# Patient Record
Sex: Female | Born: 2000 | Race: White | Hispanic: No | Marital: Single | Smoking: Former smoker
Health system: Southern US, Community
[De-identification: ages and names within clinical notes are randomized; demographics above are authoritative.]

## PROBLEM LIST (undated history)

## (undated) HISTORY — PX: WISDOM TOOTH EXTRACTION: SHX21

---
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
## 2018-05-29 ENCOUNTER — Ambulatory Visit: Payer: Medicaid Other | Admitting: Family Medicine

## 2018-06-05 ENCOUNTER — Ambulatory Visit (INDEPENDENT_AMBULATORY_CARE_PROVIDER_SITE_OTHER): Payer: Medicaid Other | Admitting: Family Medicine

## 2018-06-05 ENCOUNTER — Encounter: Payer: Self-pay | Admitting: Family Medicine

## 2018-06-05 VITALS — BP 115/71 | HR 73 | Ht 65.0 in | Wt 199.0 lb

## 2018-06-05 DIAGNOSIS — N921 Excessive and frequent menstruation with irregular cycle: Secondary | ICD-10-CM

## 2018-06-05 DIAGNOSIS — Z0000 Encounter for general adult medical examination without abnormal findings: Secondary | ICD-10-CM | POA: Diagnosis not present

## 2018-06-05 MED ORDER — NORGESTIMATE-ETH ESTRADIOL 0.25-35 MG-MCG PO TABS: 1.0000 | ORAL_TABLET | Freq: Every day | ORAL | 11 refills | Status: DC

## 2018-06-05 NOTE — Progress Notes (Signed)
Pt states that her menstrual cycles are irregular.

## 2018-06-05 NOTE — Progress Notes (Signed)
GYNECOLOGY ANNUAL PREVENTATIVE CARE ENCOUNTER NOTE  Subjective:   Isabella Sloan is a 17 y.o. G0P0000 female here for a routine annual gynecologic exam.  Current complaints: irregular bleeding on junea (estradiol 20mcg combined OCP) - bleeding outside of the hormone free times.   Denies abnormal vaginal bleeding, discharge, pelvic pain, problems with intercourse or other gynecologic concerns.    Gynecologic History Patient's last menstrual period was 05/24/2018. Patient is sexually active  Contraception: OCP (estrogen/progesterone) Last Pap: n/a.  Last mammogram: n/a.  Obstetric History OB History  Gravida Para Term Preterm AB Living  0 0 0 0 0 0  SAB TAB Ectopic Multiple Live Births  0 0 0 0 0    No past medical history on file.  Past Surgical History:  Procedure Laterality Date  . WISDOM TOOTH EXTRACTION      Current Outpatient Medications on File Prior to Visit  Medication Sig Dispense Refill  . HYDROcodone-acetaminophen (NORCO/VICODIN) 5-325 MG tablet Take 1 tablet by mouth every 6 (six) hours as needed for moderate pain or severe pain. (Patient not taking: Reported on 09/03/2017) 4 tablet 0   No current facility-administered medications on file prior to visit.     No Known Allergies  Social History   Socioeconomic History  . Marital status: Single    Spouse name: Not on file  . Number of children: Not on file  . Years of education: Not on file  . Highest education level: Not on file  Occupational History  . Not on file  Social Needs  . Financial resource strain: Not on file  . Food insecurity:    Worry: Not on file    Inability: Not on file  . Transportation needs:    Medical: Not on file    Non-medical: Not on file  Tobacco Use  . Smoking status: Current Some Day Smoker    Types: E-cigarettes  . Smokeless tobacco: Never Used  Substance and Sexual Activity  . Alcohol use: No  . Drug use: No  . Sexual activity: Yes    Birth control/protection:  Pill  Lifestyle  . Physical activity:    Days per week: Not on file    Minutes per session: Not on file  . Stress: Not on file  Relationships  . Social connections:    Talks on phone: Not on file    Gets together: Not on file    Attends religious service: Not on file    Active member of club or organization: Not on file    Attends meetings of clubs or organizations: Not on file    Relationship status: Not on file  . Intimate partner violence:    Fear of current or ex partner: Not on file    Emotionally abused: Not on file    Physically abused: Not on file    Forced sexual activity: Not on file  Other Topics Concern  . Not on file  Social History Narrative  . Not on file    Family History  Problem Relation Age of Onset  . Cancer Maternal Grandfather   . Sudden death Neg Hx   . Hypertension Neg Hx   . Hyperlipidemia Neg Hx   . Heart attack Neg Hx   . Diabetes Neg Hx     The following portions of the patient's history were reviewed and updated as appropriate: allergies, current medications, past family history, past medical history, past social history, past surgical history and problem list.  Review of  Systems Pertinent items are noted in HPI.   Objective:  BP 115/71   Pulse 73   Ht 5\' 5"  (1.651 m)   Wt 199 lb (90.3 kg)   LMP 05/24/2018   BMI 33.12 kg/m  CONSTITUTIONAL: Well-developed, well-nourished female in no acute distress.  HENT:  Normocephalic, atraumatic, External right and left ear normal. Oropharynx is clear and moist EYES: Conjunctivae and EOM are normal. Pupils are equal, round, and reactive to light. No scleral icterus.  NECK: Normal range of motion, supple, no masses.  Normal thyroid.   CARDIOVASCULAR: Normal heart rate noted, regular rhythm RESPIRATORY: Clear to auscultation bilaterally. Effort and breath sounds normal, no problems with respiration noted. BREASTS: deferred ABDOMEN: Soft, normal bowel sounds, no distention noted.  No tenderness,  rebound or guarding.  PELVIC: deferred MUSCULOSKELETAL: Normal range of motion. No tenderness.  No cyanosis, clubbing, or edema.  2+ distal pulses. SKIN: Skin is warm and dry. No rash noted. Not diaphoretic. No erythema. No pallor. NEUROLOGIC: Alert and oriented to person, place, and time. Normal reflexes, muscle tone coordination. No cranial nerve deficit noted. PSYCHIATRIC: Normal mood and affect. Normal behavior. Normal judgment and thought content.  Assessment:  Annual gynecologic examination with pap smear   Plan:  1. Well Woman Exam STD testing discussed. Patient declined testing  2. Breakthrough bleeding on birth control pills Change to sprintec (35mcg OCPs). F/u in 4 months to evaluate improvement.   Routine preventative health maintenance measures emphasized. Please refer to After Visit Summary for other counseling recommendations.    Candelaria CelesteJacob , DO [facility]

## 2019-05-02 ENCOUNTER — Encounter: Payer: Self-pay | Admitting: Emergency Medicine

## 2019-05-02 ENCOUNTER — Other Ambulatory Visit: Payer: Self-pay

## 2019-05-02 ENCOUNTER — Ambulatory Visit
Admission: EM | Admit: 2019-05-02 | Discharge: 2019-05-02 | Disposition: A | Discharge status: Home / Self Care | Payer: Medicaid Other | Attending: Emergency Medicine | Admitting: Emergency Medicine

## 2019-05-02 DIAGNOSIS — J0100 Acute maxillary sinusitis, unspecified: Secondary | ICD-10-CM

## 2019-05-02 MED ORDER — AMOXICILLIN-POT CLAVULANATE 875-125 MG PO TABS: 1.0000 | ORAL_TABLET | Freq: Two times a day (BID) | ORAL | 0 refills | Status: DC

## 2019-05-02 NOTE — ED Provider Notes (Signed)
[facility]    CSN: [id number] Arrival date & time: 05/02/19  1343      History   Chief Complaint Chief Complaint  Patient presents with  . Nasal Congestion  . Facial Pain  . Otalgia    HPI Isabella Sloan is a 18 y.o. female presenting for 4-day course of sinus congestion, pressure, bilateral ear pain, and jaw pain.  Patient has taken Tylenol, ibuprofen for symptoms with mild relief. States the sinus pressure has been causing headaches during the day, keeping her up at night.  No dental pain/issues.  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  History reviewed. No pertinent past medical history.  Patient Active Problem List   Diagnosis Date Noted  . Left knee injury 02/19/2013    Past Surgical History:  Procedure Laterality Date  . WISDOM TOOTH EXTRACTION      OB History    Gravida  0   Para  0   Term  0   Preterm  0   AB  0   Living  0     SAB  0   TAB  0   Ectopic  0   Multiple  0   Live Births  0            Home Medications    Prior to Admission medications   Medication Sig Start Date End Date Taking? Authorizing Provider  amoxicillin-clavulanate (AUGMENTIN) 875-125 MG tablet Take 1 tablet by mouth every 12 (twelve) hours. 05/02/19   Hall-Potvin, [place], PA-C  norgestimate-ethinyl estradiol (ORTHO-CYCLEN,SPRINTEC,PREVIFEM) 0.25-35 MG-MCG tablet Take 1 tablet by mouth daily. 06/05/18   Levie HeritageStinson, Jacob J, DO    Family History Family History  Problem Relation Age of Onset  . Cancer Maternal Grandfather   . Sudden death Neg Hx   . Hypertension Neg Hx   . Hyperlipidemia Neg Hx   . Heart attack Neg Hx   . Diabetes Neg Hx     Social History Social History   Tobacco Use  . Smoking status: Former Smoker    Types: E-cigarettes  . Smokeless tobacco: Never Used  Substance Use Topics  . Alcohol use: No  . Drug use: No     Allergies   Patient has no known allergies.    Review of Systems Review of Systems  Constitutional: Negative for activity change, appetite change, fatigue and fever.  HENT: Positive for congestion, ear pain, sinus pressure and sinus pain. Negative for dental problem, ear discharge, facial swelling, hearing loss, sore throat, trouble swallowing and voice change.   Eyes: Negative for photophobia, pain and visual disturbance.  Respiratory: Negative for cough and shortness of breath.   Cardiovascular: Negative for chest pain and palpitations.  Gastrointestinal: Negative for diarrhea and vomiting.  Musculoskeletal: Negative for arthralgias and myalgias.  Neurological: Negative for dizziness and headaches.     Physical Exam Triage Vital Signs ED Triage Vitals  Enc Vitals Group     BP 05/02/19 1424 (!) 144/84     Pulse Rate 05/02/19 1424 89     Resp 05/02/19 1424 16     Temp 05/02/19 1424 98.2 F (36.8 C)     Temp Source 05/02/19 1424 Temporal     SpO2 05/02/19 1424 97 %     Weight --      Height --      Head Circumference --      Peak Flow --      Pain Score  05/02/19 1423 3     Pain Loc --      Pain Edu? --      Excl. in [place]? --    No data found.  Updated Vital Signs BP (!) 144/84 (BP Location: Left Arm)   Pulse 89   Temp 98.2 F (36.8 C) (Temporal)   Resp 16   LMP 04/22/2019   SpO2 97%   Visual Acuity Right Eye Distance:   Left Eye Distance:   Bilateral Distance:    Right Eye Near:   Left Eye Near:    Bilateral Near:     Physical Exam Constitutional:      General: She is not in acute distress.    Appearance: She is obese. She is not toxic-appearing.  HENT:     Head: Normocephalic and atraumatic.     Jaw: There is normal jaw occlusion. No tenderness or pain on movement.     Right Ear: Hearing, ear canal and external ear normal. No tenderness. No mastoid tenderness.     Left Ear: Hearing, ear canal and external ear normal. No tenderness. No mastoid tenderness.     Ears:     Comments: Negative tragal  tenderness bilaterally.  TMs intact bilaterally, though slight bulging (R>L>    Nose: Congestion present. No nasal deformity, septal deviation or nasal tenderness.     Right Turbinates: Not swollen or pale.     Left Turbinates: Not swollen or pale.     Right Sinus: No maxillary sinus tenderness or frontal sinus tenderness.     Left Sinus: No maxillary sinus tenderness or frontal sinus tenderness.     Comments: Maxillary sinus tenderness bilaterally    Mouth/Throat:     Lips: Pink. No lesions.     Mouth: Mucous membranes are moist. No injury.     Pharynx: Oropharynx is clear. Uvula midline. No oropharyngeal exudate, posterior oropharyngeal erythema or uvula swelling.     Comments: no tonsillar exudate or hypertrophy Eyes:     General: No scleral icterus.    Conjunctiva/sclera: Conjunctivae normal.     Pupils: Pupils are equal, round, and reactive to light.  Neck:     Musculoskeletal: Normal range of motion and neck supple. No muscular tenderness.  Cardiovascular:     Rate and Rhythm: Normal rate and regular rhythm.  Pulmonary:     Effort: Pulmonary effort is normal. No respiratory distress.     Breath sounds: No wheezing.  Lymphadenopathy:     Cervical: Cervical adenopathy present.  Skin:    General: Skin is warm.     Capillary Refill: Capillary refill takes less than 2 seconds.     Findings: No erythema or rash.  Neurological:     General: No focal deficit present.     Mental Status: She is alert and oriented to person, place, and time.      UC Treatments / Results  Labs (all labs ordered are listed, but only abnormal results are displayed) Labs Reviewed - No data to display  EKG   Radiology No results found.  Procedures Procedures (including critical care time)  Medications Ordered in UC Medications - No data to display  Initial Impression / Assessment and Plan / UC Course  I have reviewed the triage vital signs and the nursing notes.  Pertinent labs & imaging  results that were available during my care of the patient were reviewed by me and considered in my medical decision making (see chart for details).     Will  start Augmentin today for acute maxillary sinusitis.  Will treat with additional supportive measures outlined below.  Return precautions discussed, patient verbalized understanding and is agreeable to plan. Final Clinical Impressions(s) / UC Diagnoses   Final diagnoses:  Acute non-recurrent maxillary sinusitis     Discharge Instructions     Take antibiotic as prescribed with food -please note this may decrease efficacy of birth control, so backup methods (such as condoms) to be used. Important to finish your antibiotic course completely. Take Tylenol, ibuprofen, drink more water, and use intranasal steroid (Flonase, Nasacort), as well as daytime allergy medications for additional symptom relief. Return for worsening pain, fever, cough, shortness of breath.    ED Prescriptions    Medication Sig Dispense Auth. Provider   amoxicillin-clavulanate (AUGMENTIN) 875-125 MG tablet Take 1 tablet by mouth every 12 (twelve) hours. 14 tablet Hall-Potvin, [place], PA-C     PDMP not reviewed this encounter.   Hall-Potvin, [place], [place] 05/02/19 1531

## 2019-05-02 NOTE — ED Triage Notes (Addendum)
Pt presents to [facility] for 4 days of sinus congestion, pressure, "bubbling", right ear and jaw pain.  This RN offered patient COVID test, she refused at this time.

## 2019-05-02 NOTE — ED Notes (Signed)
Patient able to ambulate independently  

## 2019-05-02 NOTE — Discharge Instructions (Signed)
Take antibiotic as prescribed with food -please note this may decrease efficacy of birth control, so backup methods (such as condoms) to be used. Important to finish your antibiotic course completely. Take Tylenol, ibuprofen, drink more water, and use intranasal steroid (Flonase, Nasacort), as well as daytime allergy medications for additional symptom relief. Return for worsening pain, fever, cough, shortness of breath.

## 2019-05-20 ENCOUNTER — Other Ambulatory Visit: Payer: Self-pay | Admitting: Family Medicine

## 2019-05-20 ENCOUNTER — Other Ambulatory Visit: Payer: Self-pay

## 2019-05-20 DIAGNOSIS — Z309 Encounter for contraceptive management, unspecified: Secondary | ICD-10-CM

## 2019-05-20 MED ORDER — NORGESTIMATE-ETH ESTRADIOL 0.25-35 MG-MCG PO TABS: 1.0000 | ORAL_TABLET | Freq: Every day | ORAL | 2 refills | Status: DC

## 2019-05-20 NOTE — Progress Notes (Signed)
Pt called the office requesting a refill of OCPs. Rx was sent to pt's pharmacy with 2 refills.Pt made aware that she needs to schedule appt for annual. Pt states she will call at a later date to schedule appt for annual.  Isabella Sloan l Bekah Igoe, CMA

## 2019-07-13 ENCOUNTER — Other Ambulatory Visit: Payer: Self-pay

## 2019-07-13 DIAGNOSIS — Z3041 Encounter for surveillance of contraceptive pills: Secondary | ICD-10-CM

## 2019-07-13 MED ORDER — NORGESTIMATE-ETH ESTRADIOL 0.25-35 MG-MCG PO TABS: 1.0000 | ORAL_TABLET | Freq: Every day | ORAL | 3 refills | Status: DC

## 2019-08-06 ENCOUNTER — Ambulatory Visit: Payer: Medicaid Other | Admitting: Family Medicine

## 2019-09-01 ENCOUNTER — Other Ambulatory Visit: Payer: Self-pay

## 2019-09-01 ENCOUNTER — Other Ambulatory Visit (HOSPITAL_COMMUNITY)
Admission: RE | Admit: 2019-09-01 | Discharge: 2019-09-01 | Disposition: A | Discharge status: Home / Self Care | Payer: Medicaid Other | Source: Ambulatory Visit | Attending: Family Medicine | Admitting: Family Medicine

## 2019-09-01 ENCOUNTER — Encounter: Payer: Self-pay | Admitting: Advanced Practice Midwife

## 2019-09-01 ENCOUNTER — Ambulatory Visit (INDEPENDENT_AMBULATORY_CARE_PROVIDER_SITE_OTHER): Payer: Medicaid Other | Admitting: Advanced Practice Midwife

## 2019-09-01 VITALS — BP 116/60 | HR 68 | Ht 67.0 in | Wt 200.0 lb

## 2019-09-01 DIAGNOSIS — Z124 Encounter for screening for malignant neoplasm of cervix: Secondary | ICD-10-CM | POA: Insufficient documentation

## 2019-09-01 DIAGNOSIS — Z01419 Encounter for gynecological examination (general) (routine) without abnormal findings: Secondary | ICD-10-CM

## 2019-09-01 DIAGNOSIS — Z3041 Encounter for surveillance of contraceptive pills: Secondary | ICD-10-CM | POA: Insufficient documentation

## 2019-09-01 MED ORDER — NORGESTIMATE-ETH ESTRADIOL 0.25-35 MG-MCG PO TABS: 1.0000 | ORAL_TABLET | Freq: Every day | ORAL | 3 refills | Status: DC

## 2019-09-01 NOTE — Patient Instructions (Signed)
Health Maintenance, Female Adopting a healthy lifestyle and getting preventive care are important in promoting health and wellness. Ask your health care provider about:  The right schedule for you to have regular tests and exams.  Things you can do on your own to prevent diseases and keep yourself healthy. What should I know about diet, weight, and exercise? Eat a healthy diet   Eat a diet that includes plenty of vegetables, fruits, low-fat dairy products, and lean protein.  Do not eat a lot of foods that are high in solid fats, added sugars, or sodium. Maintain a healthy weight Body mass index (BMI) is used to identify weight problems. It estimates body fat based on height and weight. Your health care provider can help determine your BMI and help you achieve or maintain a healthy weight. Get regular exercise Get regular exercise. This is one of the most important things you can do for your health. Most adults should:  Exercise for at least 150 minutes each week. The exercise should increase your heart rate and make you sweat (moderate-intensity exercise).  Do strengthening exercises at least twice a week. This is in addition to the moderate-intensity exercise.  Spend less time sitting. Even light physical activity can be beneficial. Watch cholesterol and blood lipids Have your blood tested for lipids and cholesterol at 20 years of age, then have this test every 5 years. Have your cholesterol levels checked more often if:  Your lipid or cholesterol levels are high.  You are older than 21 years of age.  You are at high risk for heart disease. What should I know about cancer screening? Depending on your health history and family history, you may need to have cancer screening at various ages. This may include screening for:  Breast cancer.  Cervical cancer.  Colorectal cancer.  Skin cancer.  Lung cancer. What should I know about heart disease, diabetes, and high blood  pressure? Blood pressure and heart disease  High blood pressure causes heart disease and increases the risk of stroke. This is more likely to develop in people who have high blood pressure readings, are of African descent, or are overweight.  Have your blood pressure checked: ? Every 3-5 years if you are 18-39 years of age. ? Every year if you are 40 years old or older. Diabetes Have regular diabetes screenings. This checks your fasting blood sugar level. Have the screening done:  Once every three years after age 40 if you are at a normal weight and have a low risk for diabetes.  More often and at a younger age if you are overweight or have a high risk for diabetes. What should I know about preventing infection? Hepatitis B If you have a higher risk for hepatitis B, you should be screened for this virus. Talk with your health care provider to find out if you are at risk for hepatitis B infection. Hepatitis C Testing is recommended for:  Everyone born from 1945 through 1965.  Anyone with known risk factors for hepatitis C. Sexually transmitted infections (STIs)  Get screened for STIs, including gonorrhea and chlamydia, if: ? You are sexually active and are younger than 21 years of age. ? You are older than 21 years of age and your health care provider tells you that you are at risk for this type of infection. ? Your sexual activity has changed since you were last screened, and you are at increased risk for chlamydia or gonorrhea. Ask your health care provider if   you are at risk.  Ask your health care provider about whether you are at high risk for HIV. Your health care provider may recommend a prescription medicine to help prevent HIV infection. If you choose to take medicine to prevent HIV, you should first get tested for HIV. You should then be tested every 3 months for as long as you are taking the medicine. Pregnancy  If you are about to stop having your period (premenopausal) and  you may become pregnant, seek counseling before you get pregnant.  Take 400 to 800 micrograms (mcg) of folic acid every day if you become pregnant.  Ask for birth control (contraception) if you want to prevent pregnancy. Osteoporosis and menopause Osteoporosis is a disease in which the bones lose minerals and strength with aging. This can result in bone fractures. If you are 65 years old or older, or if you are at risk for osteoporosis and fractures, ask your health care provider if you should:  Be screened for bone loss.  Take a calcium or vitamin D supplement to lower your risk of fractures.  Be given hormone replacement therapy (HRT) to treat symptoms of menopause. Follow these instructions at home: Lifestyle  Do not use any products that contain nicotine or tobacco, such as cigarettes, e-cigarettes, and chewing tobacco. If you need help quitting, ask your health care provider.  Do not use street drugs.  Do not share needles.  Ask your health care provider for help if you need support or information about quitting drugs. Alcohol use  Do not drink alcohol if: ? Your health care provider tells you not to drink. ? You are pregnant, may be pregnant, or are planning to become pregnant.  If you drink alcohol: ? Limit how much you use to 0-1 drink a day. ? Limit intake if you are breastfeeding.  Be aware of how much alcohol is in your drink. In the [place] one drink equals one 12 oz bottle of beer (355 mL), one 5 oz glass of wine (148 mL), or one 1 oz glass of hard liquor (44 mL). General instructions  Schedule regular health, dental, and eye exams.  Stay current with your vaccines.  Tell your health care provider if: ? You often feel depressed. ? You have ever been abused or do not feel safe at home. Summary  Adopting a healthy lifestyle and getting preventive care are important in promoting health and wellness.  Follow your health care provider's instructions about healthy  diet, exercising, and getting tested or screened for diseases.  Follow your health care provider's instructions on monitoring your cholesterol and blood pressure. This information is not intended to replace advice given to you by your health care provider. Make sure you discuss any questions you have with your health care provider. Document Revised: 05/21/2018 Document Reviewed: 05/21/2018 Elsevier Patient Education  2020 Elsevier Inc[employer]  

## 2019-09-01 NOTE — Progress Notes (Signed)
GYNECOLOGY ANNUAL PREVENTATIVE CARE ENCOUNTER NOTE  Subjective:   Isabella Sloan is a 19 y.o. G0P0000 female here for a routine annual gynecologic exam.  Current complaints: none.  Would like refill on Sprintec.  Doing well on this pill.   Denies abnormal vaginal bleeding, discharge, pelvic pain, problems with intercourse or other gynecologic concerns.   Had one Gardasil vaccine but declined subsequent vaccines  States 3-year relationship with boyfriend is stable, plans to move in together soon.    Gynecologic History Patient's last menstrual period was 08/10/2019 (within days). Contraception: OCP (estrogen/progesterone) Last Pap: never  Obstetric History OB History  Gravida Para Term Preterm AB Living  0 0 0 0 0 0  SAB TAB Ectopic Multiple Live Births  0 0 0 0 0    History reviewed. No pertinent past medical history.  Past Surgical History:  Procedure Laterality Date  . WISDOM TOOTH EXTRACTION      Current Outpatient Medications on File Prior to Visit  Medication Sig Dispense Refill  . norgestimate-ethinyl estradiol (SPRINTEC 28) 0.25-35 MG-MCG tablet Take 1 tablet by mouth daily. 3 Package 3  . amoxicillin-clavulanate (AUGMENTIN) 875-125 MG tablet Take 1 tablet by mouth every 12 (twelve) hours. (Patient not taking: Reported on 09/01/2019) 14 tablet 0  . norgestimate-ethinyl estradiol (ORTHO-CYCLEN) 0.25-35 MG-MCG tablet Take 1 tablet by mouth daily. (Patient not taking: Reported on 09/01/2019) 1 Package 2   No current facility-administered medications on file prior to visit.    No Known Allergies  Social History   Socioeconomic History  . Marital status: Single    Spouse name: Not on file  . Number of children: Not on file  . Years of education: Not on file  . Highest education level: Not on file  Occupational History  . Not on file  Tobacco Use  . Smoking status: Former Smoker    Types: E-cigarettes  . Smokeless tobacco: Never Used  Substance and Sexual Activity   . Alcohol use: No  . Drug use: No  . Sexual activity: Yes    Birth control/protection: Pill  Other Topics Concern  . Not on file  Social History Narrative  . Not on file   Social Determinants of Health   Financial Resource Strain:   . Difficulty of Paying Living Expenses:   Food Insecurity:   . Worried About Charity fundraiser in the Last Year:   . Arboriculturist in the Last Year:   Transportation Needs:   . Film/video editor (Medical):   Marland Kitchen Lack of Transportation (Non-Medical):   Physical Activity:   . Days of Exercise per Week:   . Minutes of Exercise per Session:   Stress:   . Feeling of Stress :   Social Connections:   . Frequency of Communication with Friends and Family:   . Frequency of Social Gatherings with Friends and Family:   . Attends Religious Services:   . Active Member of Clubs or Organizations:   . Attends Archivist Meetings:   Marland Kitchen Marital Status:   Intimate Partner Violence:   . Fear of Current or Ex-Partner:   . Emotionally Abused:   Marland Kitchen Physically Abused:   . Sexually Abused:     Family History  Problem Relation Age of Onset  . Cancer Maternal Grandfather   . Sudden death Neg Hx   . Hypertension Neg Hx   . Hyperlipidemia Neg Hx   . Heart attack Neg Hx   . Diabetes Neg Hx  The following portions of the patient's history were reviewed and updated as appropriate: allergies, current medications, past family history, past medical history, past social history, past surgical history and problem list.  Review of Systems Pertinent items noted in HPI and remainder of comprehensive ROS otherwise negative.   Objective:  BP 116/60   Pulse 68   Ht 5\' 7"  (1.702 m)   Wt 200 lb (90.7 kg)   LMP 08/10/2019 (Within Days)   BMI 31.32 kg/m  CONSTITUTIONAL: Well-developed, well-nourished female in no acute distress.  HENT:  Normocephalic, atraumatic, External right and left ear normal. Oropharynx is clear and moist EYES: Conjunctivae and  EOM are normal. No scleral icterus.  NECK: Normal range of motion, supple, no masses.  Normal thyroid.  SKIN: Skin is warm and dry. No rash noted. Not diaphoretic. No erythema. No pallor. NEUROLOGIC: Alert and oriented to person, place, and time. Normal reflexes, muscle tone coordination. No cranial nerve deficit noted. PSYCHIATRIC: Normal mood and affect. Normal behavior. Normal judgment and thought content. CARDIOVASCULAR: Normal heart rate noted, regular rhythm RESPIRATORY: Clear to auscultation bilaterally. Effort and breath sounds normal, no problems with respiration noted. BREASTS: Symmetric in size. No masses, skin changes, nipple drainage, or lymphadenopathy. ABDOMEN: Soft, normal bowel sounds, no distention noted.  No tenderness, rebound or guarding.  PELVIC: Normal appearing external genitalia; normal appearing vaginal mucosa and cervix.  No abnormal discharge noted.  Pap smear obtained.  Normal uterine size, no other palpable masses, no uterine or adnexal tenderness. MUSCULOSKELETAL: Normal range of motion. No tenderness.  No cyanosis, clubbing, or edema.  2+ distal pulses.   Assessment:  Annual gynecologic examination with pap smear Requests refill on Sprintec OCPs   Plan:  Will follow up results of pap smear and manage accordingly. Reviewed risks of HPV/vaccine. Routine preventative health maintenance measures emphasized. Please refer to After Visit Summary for other counseling recommendations.

## 2019-09-01 NOTE — Progress Notes (Signed)
Patient presents for annual exam and continuation of oral contraceptives. Armandina Stammer RN

## 2019-09-02 LAB — CYTOLOGY - PAP: Diagnosis: NEGATIVE

## 2020-08-31 ENCOUNTER — Encounter: Payer: Self-pay | Admitting: Family Medicine

## 2020-08-31 ENCOUNTER — Other Ambulatory Visit: Payer: Self-pay

## 2020-08-31 ENCOUNTER — Ambulatory Visit (INDEPENDENT_AMBULATORY_CARE_PROVIDER_SITE_OTHER): Payer: Medicaid Other | Admitting: Family Medicine

## 2020-08-31 VITALS — BP 128/69 | HR 54 | Ht 66.5 in | Wt 175.0 lb

## 2020-08-31 DIAGNOSIS — Z01419 Encounter for gynecological examination (general) (routine) without abnormal findings: Secondary | ICD-10-CM | POA: Diagnosis not present

## 2020-08-31 NOTE — Progress Notes (Signed)
GYNECOLOGY ANNUAL PREVENTATIVE CARE ENCOUNTER NOTE  Subjective:   Isabella Sloan is a 20 y.o. G0P0000 female here for a routine annual gynecologic exam.  Current complaints: none.   Denies abnormal vaginal bleeding, discharge, pelvic pain, problems with intercourse or other gynecologic concerns.    Gynecologic History No LMP recorded. (Menstrual status: Oral contraceptives). Patient is sexually active  Contraception: OCP (estrogen/progesterone) Last Pap: 2021. Results were: normal Last mammogram: none. Results were: normal  Obstetric History OB History  Gravida Para Term Preterm AB Living  0 0 0 0 0 0  SAB IAB Ectopic Multiple Live Births  0 0 0 0 0    No past medical history on file.  Past Surgical History:  Procedure Laterality Date  . WISDOM TOOTH EXTRACTION      Current Outpatient Medications on File Prior to Visit  Medication Sig Dispense Refill  . norgestimate-ethinyl estradiol (SPRINTEC 28) 0.25-35 MG-MCG tablet Take 1 tablet by mouth daily. 3 Package 3  . amoxicillin-clavulanate (AUGMENTIN) 875-125 MG tablet Take 1 tablet by mouth every 12 (twelve) hours. (Patient not taking: Reported on 09/01/2019) 14 tablet 0  . norgestimate-ethinyl estradiol (ORTHO-CYCLEN) 0.25-35 MG-MCG tablet Take 1 tablet by mouth daily. (Patient not taking: Reported on 09/01/2019) 1 Package 2   No current facility-administered medications on file prior to visit.    No Known Allergies  Social History   Socioeconomic History  . Marital status: Single    Spouse name: Not on file  . Number of children: Not on file  . Years of education: Not on file  . Highest education level: High school graduate  Occupational History  . Not on file  Tobacco Use  . Smoking status: Former Smoker    Types: E-cigarettes  . Smokeless tobacco: Never Used  Vaping Use  . Vaping Use: Every day  Substance and Sexual Activity  . Alcohol use: No  . Drug use: No  . Sexual activity: Yes    Birth  control/protection: Pill  Other Topics Concern  . Not on file  Social History Narrative  . Not on file   Social Determinants of Health   Financial Resource Strain: Not on file  Food Insecurity: Not on file  Transportation Needs: Not on file  Physical Activity: Not on file  Stress: Not on file  Social Connections: Not on file  Intimate Partner Violence: Not on file    Family History  Problem Relation Age of Onset  . Cancer Maternal Grandfather   . Hypertension Mother   . Sudden death Neg Hx   . Hyperlipidemia Neg Hx   . Heart attack Neg Hx   . Diabetes Neg Hx     The following portions of the patient's history were reviewed and updated as appropriate: allergies, current medications, past family history, past medical history, past social history, past surgical history and problem list.  Review of Systems Pertinent items are noted in HPI.   Objective:  BP 128/69   Pulse (!) 54   Ht 5' 6.5" (1.689 m)   Wt 175 lb (79.4 kg)   BMI 27.82 kg/m  Wt Readings from Last 3 Encounters:  08/31/20 175 lb (79.4 kg)  09/01/19 200 lb (90.7 kg)  06/05/18 199 lb (90.3 kg) (97 %, Z= 1.93)*   * Growth percentiles are based on CDC (Girls, 2-20 Years) data.     Chaperone present during exam  CONSTITUTIONAL: Well-developed, well-nourished female in no acute distress.  HENT:  Normocephalic, atraumatic, External right and  left ear normal. Oropharynx is clear and moist EYES: Conjunctivae and EOM are normal. Pupils are equal, round, and reactive to light. No scleral icterus.  NECK: Normal range of motion, supple, no masses.  Normal thyroid.   CARDIOVASCULAR: Normal heart rate noted, regular rhythm RESPIRATORY: Clear to auscultation bilaterally. Effort and breath sounds normal, no problems with respiration noted. BREASTS: Symmetric in size. No masses, skin changes, nipple drainage, or lymphadenopathy. ABDOMEN: Soft, normal bowel sounds, no distention noted.  No tenderness, rebound or  guarding.  PELVIC: Normal appearing external genitalia; normal appearing vaginal mucosa and cervix.  No abnormal discharge noted.  Normal uterine size, no other palpable masses, no uterine or adnexal tenderness. MUSCULOSKELETAL: Normal range of motion. No tenderness.  No cyanosis, clubbing, or edema.  2+ distal pulses. SKIN: Skin is warm and dry. No rash noted. Not diaphoretic. No erythema. No pallor. NEUROLOGIC: Alert and oriented to person, place, and time. Normal reflexes, muscle tone coordination. No cranial nerve deficit noted. PSYCHIATRIC: Normal mood and affect. Normal behavior. Normal judgment and thought content.  Assessment:  Annual gynecologic examination with pap smear   Plan:  1. Well Woman Exam Will follow up results of pap smear and manage accordingly. STD testing discussed. Patient declined testing  Routine preventative health maintenance measures emphasized. Please refer to After Visit Summary for other counseling recommendations.    Candelaria Celeste, DO [facility]

## 2020-12-07 ENCOUNTER — Other Ambulatory Visit: Payer: Self-pay

## 2020-12-07 DIAGNOSIS — Z3041 Encounter for surveillance of contraceptive pills: Secondary | ICD-10-CM

## 2020-12-07 MED ORDER — NORGESTIMATE-ETH ESTRADIOL 0.25-35 MG-MCG PO TABS: 1.0000 | ORAL_TABLET | Freq: Every day | ORAL | 3 refills | Status: DC

## 2020-12-23 ENCOUNTER — Other Ambulatory Visit: Payer: Self-pay

## 2020-12-23 ENCOUNTER — Ambulatory Visit
Admission: EM | Admit: 2020-12-23 | Discharge: 2020-12-23 | Disposition: A | Discharge status: Home / Self Care | Payer: Medicaid Other | Attending: Urgent Care | Admitting: Urgent Care

## 2020-12-23 DIAGNOSIS — R350 Frequency of micturition: Secondary | ICD-10-CM | POA: Insufficient documentation

## 2020-12-23 DIAGNOSIS — R300 Dysuria: Secondary | ICD-10-CM

## 2020-12-23 DIAGNOSIS — N3001 Acute cystitis with hematuria: Secondary | ICD-10-CM

## 2020-12-23 LAB — POCT URINE PREGNANCY: Preg Test, Ur: NEGATIVE

## 2020-12-23 MED ORDER — CEPHALEXIN 500 MG PO CAPS: 500.0000 mg | ORAL_CAPSULE | Freq: Two times a day (BID) | ORAL | 0 refills | Status: DC

## 2020-12-23 NOTE — Discharge Instructions (Addendum)

## 2020-12-23 NOTE — ED Provider Notes (Signed)
[facility]   MRN: [id number] DOB: 04-Feb-2001  Subjective:   Isabella Sloan is a 20 y.o. female presenting for 1 day history of dysuria, urinary frequency, urinary urgency. Denies vaginal discharge, n/v, abdominal or pelvic pain. Tries to hydrate well with water and does drink some tea. No concern for an STI or pregnancy.   No current facility-administered medications for this encounter.  Current Outpatient Medications:    norgestimate-ethinyl estradiol (SPRINTEC 28) 0.25-35 MG-MCG tablet, Take 1 tablet by mouth daily., Disp: 28 tablet, Rfl: 3   No Known Allergies  History reviewed. No pertinent past medical history.   Past Surgical History:  Procedure Laterality Date   WISDOM TOOTH EXTRACTION      Family History  Problem Relation Age of Onset   Cancer Maternal Grandfather    Hypertension Mother    Sudden death Neg Hx    Hyperlipidemia Neg Hx    Heart attack Neg Hx    Diabetes Neg Hx     Social History   Tobacco Use   Smoking status: Former    Types: E-cigarettes   Smokeless tobacco: Never  Building services engineer Use: Every day  Substance Use Topics   Alcohol use: No   Drug use: No    ROS   Objective:   Vitals: BP 123/82 (BP Location: Left Arm)   Pulse 84   Temp 98.7 F (37.1 C) (Oral)   Resp 18   SpO2 97%   Physical Exam Constitutional:      General: She is not in acute distress.    Appearance: Normal appearance. She is well-developed. She is not ill-appearing, toxic-appearing or diaphoretic.  HENT:     Head: Normocephalic and atraumatic.     Nose: Nose normal.     Mouth/Throat:     Mouth: Mucous membranes are moist.     Pharynx: Oropharynx is clear.  Eyes:     General: No scleral icterus.       Right eye: No discharge.        Left eye: No discharge.     Extraocular Movements: Extraocular movements intact.     Conjunctiva/sclera: Conjunctivae normal.     Pupils: Pupils are equal, round, and reactive to light.  Cardiovascular:      Rate and Rhythm: Normal rate.  Pulmonary:     Effort: Pulmonary effort is normal.  Abdominal:     General: Bowel sounds are normal. There is no distension.     Palpations: Abdomen is soft. There is no mass.     Tenderness: no abdominal tenderness There is no right CVA tenderness, left CVA tenderness, guarding or rebound.  Skin:    General: Skin is warm and dry.  Neurological:     General: No focal deficit present.     Mental Status: She is alert and oriented to person, place, and time.  Psychiatric:        Mood and Affect: Mood normal.        Behavior: Behavior normal.        Thought Content: Thought content normal.        Judgment: Judgment normal.    Results for orders placed or performed during the hospital encounter of 12/23/20 (from the past 24 hour(s))  POCT urine pregnancy     Status: None   Collection Time: 12/23/20  2:02 PM  Result Value Ref Range   Preg Test, Ur Negative Negative    Assessment and Plan :   PDMP not reviewed  this encounter.  1. Acute cystitis with hematuria   2. Dysuria   3. Urinary frequency     Start Keflex to cover for acute cystitis, urine culture pending.  Recommended aggressive hydration, limiting urinary irritants. Counseled patient on potential for adverse effects with medications prescribed/recommended today, ER and return-to-clinic precautions discussed, patient verbalized understanding.    Wallis Bamberg, [place] 12/23/20 0340

## 2020-12-23 NOTE — ED Triage Notes (Signed)
Pt c/o burning on urination, bladder pressure, and urinary urgency/frequency since last night.

## 2020-12-23 NOTE — ED Notes (Signed)
I did urinalysis 3 times. Due to quality of urine, the clintek machine unable to result urine. I am sending urine culture to lab for processing.

## 2020-12-25 LAB — URINE CULTURE: Culture: >=100000 — AB

## 2021-01-12 ENCOUNTER — Other Ambulatory Visit: Payer: Self-pay

## 2021-01-12 ENCOUNTER — Ambulatory Visit
Admission: EM | Admit: 2021-01-12 | Discharge: 2021-01-12 | Disposition: A | Discharge status: Home / Self Care | Payer: Medicaid Other | Attending: Family Medicine | Admitting: Family Medicine

## 2021-01-12 ENCOUNTER — Ambulatory Visit: Payer: Medicaid Other | Admitting: Obstetrics and Gynecology

## 2021-01-12 DIAGNOSIS — N760 Acute vaginitis: Secondary | ICD-10-CM

## 2021-01-12 DIAGNOSIS — R300 Dysuria: Secondary | ICD-10-CM

## 2021-01-12 DIAGNOSIS — R102 Pelvic and perineal pain: Secondary | ICD-10-CM

## 2021-01-12 LAB — POCT URINALYSIS DIP (MANUAL ENTRY)
Bilirubin, UA: NEGATIVE
Blood, UA: NEGATIVE
Glucose, UA: NEGATIVE mg/dL
Ketones, POC UA: NEGATIVE mg/dL
Leukocytes, UA: NEGATIVE
Nitrite, UA: NEGATIVE
Protein Ur, POC: NEGATIVE mg/dL
Spec Grav, UA: 1.025 (ref 1.010–1.025)
Urobilinogen, UA: 0.2 E.U./dL
pH, UA: 6 (ref 5.0–8.0)

## 2021-01-12 MED ORDER — FLUCONAZOLE 150 MG PO TABS: 150.0000 mg | ORAL_TABLET | ORAL | 0 refills | Status: AC

## 2021-01-12 MED ORDER — METRONIDAZOLE 500 MG PO TABS: 500.0000 mg | ORAL_TABLET | Freq: Two times a day (BID) | ORAL | 0 refills | Status: DC

## 2021-01-12 NOTE — ED Provider Notes (Signed)
[facility]    CSN: [id number] Arrival date & time: 01/12/21  1219      History   Chief Complaint Chief Complaint  Patient presents with  . Dysuria  . Urinary Frequency    HPI Isabella Sloan is a 20 y.o. female.   Patient presenting today with 2-day history of urinary frequency, urgency, dysuria, low back aching, suprapubic abdominal pain, vaginal discharge.  States she was seen on 12/23/2020 for a urinary tract infection, given Keflex which she completed with resolution of symptoms up until this point.  States since coming off of her menstrual cycle a week and a half ago and going to the beach for vacation, she has had vaginal irritation and discharge and now these urinary symptoms as returned.  Using scented feminine washes with no relief.  Denies fever, chills, hematuria, nausea, vomiting, bowel changes.  Consistent with her oral contraceptives.   History reviewed. No pertinent past medical history.  Patient Active Problem List   Diagnosis Date Noted  . Left knee injury 02/19/2013    Past Surgical History:  Procedure Laterality Date  . WISDOM TOOTH EXTRACTION      OB History     Gravida  0   Para  0   Term  0   Preterm  0   AB  0   Living  0      SAB  0   IAB  0   Ectopic  0   Multiple  0   Live Births  0            Home Medications    Prior to Admission medications   Medication Sig Start Date End Date Taking? Authorizing Provider  fluconazole (DIFLUCAN) 150 MG tablet Take 1 tablet (150 mg total) by mouth once a week. 01/12/21  Yes Particia Nearing, PA-C  metroNIDAZOLE (FLAGYL) 500 MG tablet Take 1 tablet (500 mg total) by mouth 2 (two) times daily. 01/12/21  Yes Particia Nearing, PA-C  cephALEXin (KEFLEX) 500 MG capsule Take 1 capsule (500 mg total) by mouth 2 (two) times daily. 12/23/20   Wallis Bamberg, PA-C  norgestimate-ethinyl estradiol (SPRINTEC 28) 0.25-35 MG-MCG tablet Take 1 tablet by mouth daily. 12/07/20    Willodean Rosenthal, MD    Family History Family History  Problem Relation Age of Onset  . Cancer Maternal Grandfather   . Hypertension Mother   . Sudden death Neg Hx   . Hyperlipidemia Neg Hx   . Heart attack Neg Hx   . Diabetes Neg Hx     Social History Social History   Tobacco Use  . Smoking status: Former    Types: E-cigarettes  . Smokeless tobacco: Never  Vaping Use  . Vaping Use: Every day  Substance Use Topics  . Alcohol use: No  . Drug use: No     Allergies   Patient has no known allergies.   Review of Systems Review of Systems Per HPI  Physical Exam Triage Vital Signs ED Triage Vitals  Enc Vitals Group     BP 01/12/21 1257 127/79     Pulse Rate 01/12/21 1257 67     Resp 01/12/21 1257 18     Temp 01/12/21 1257 98.2 F (36.8 C)     Temp Source 01/12/21 1257 Oral     SpO2 01/12/21 1257 95 %     Weight --      Height --      Head Circumference --  Peak Flow --      Pain Score 01/12/21 1300 7     Pain Loc --      Pain Edu? --      Excl. in [place]? --    No data found.  Updated Vital Signs BP 127/79 (BP Location: Left Arm)   Pulse 67   Temp 98.2 F (36.8 C) (Oral)   Resp 18   SpO2 95%   Visual Acuity Right Eye Distance:   Left Eye Distance:   Bilateral Distance:    Right Eye Near:   Left Eye Near:    Bilateral Near:     Physical Exam Vitals and nursing note reviewed.  Constitutional:      Appearance: Normal appearance. She is not ill-appearing.  HENT:     Head: Atraumatic.  Eyes:     Extraocular Movements: Extraocular movements intact.     Conjunctiva/sclera: Conjunctivae normal.  Cardiovascular:     Rate and Rhythm: Normal rate and regular rhythm.     Heart sounds: Normal heart sounds.  Pulmonary:     Effort: Pulmonary effort is normal. No respiratory distress.     Breath sounds: Normal breath sounds. No wheezing or rales.  Abdominal:     General: Bowel sounds are normal. There is no distension.     Palpations:  Abdomen is soft.     Tenderness: There is no abdominal tenderness. There is no right CVA tenderness, left CVA tenderness or guarding.  Musculoskeletal:        General: Normal range of motion.     Cervical back: Normal range of motion and neck supple.  Skin:    General: Skin is warm and dry.  Neurological:     Mental Status: She is alert and oriented to person, place, and time.  Psychiatric:        Mood and Affect: Mood normal.        Thought Content: Thought content normal.        Judgment: Judgment normal.     UC Treatments / Results  Labs (all labs ordered are listed, but only abnormal results are displayed) Labs Reviewed  POCT URINALYSIS DIP (MANUAL ENTRY)    EKG   Radiology No results found.  Procedures Procedures (including critical care time)  Medications Ordered in UC Medications - No data to display  Initial Impression / Assessment and Plan / UC Course  I have reviewed the triage vital signs and the nursing notes.  Pertinent labs & imaging results that were available during my care of the patient were reviewed by me and considered in my medical decision making (see chart for details).     Vital signs and exam benign and reassuring, UA without evidence of lingering urinary tract infection.  Discussed with patient that her symptoms are likely related to a vaginal infection, either from the feminine wash that she has been using over-the-counter or her recent antibiotic use.  She is concerned about cost of further testing today as she is self-pay.  Will forego vaginal swab today because of this and treat for both yeast and BV.  She is aware that if she is not symptomatically improving over the next few days that she will need to follow-up for further evaluation.  Discussed DC of feminine washes, any scented products to this area.  Good vaginal hygiene and safe sexual practices reviewed.  Follow-up if worsening.  Final Clinical Impressions(s) / UC Diagnoses   Final  diagnoses:  Acute vaginitis  Suprapubic abdominal pain  Dysuria   Discharge Instructions   None    ED Prescriptions     Medication Sig Dispense Auth. Provider   fluconazole (DIFLUCAN) 150 MG tablet Take 1 tablet (150 mg total) by mouth once a week. 2 tablet Particia Nearing, PA-C   metroNIDAZOLE (FLAGYL) 500 MG tablet Take 1 tablet (500 mg total) by mouth 2 (two) times daily. 14 tablet Particia Nearing, [place]      PDMP not reviewed this encounter.   Particia Nearing, [place] 01/12/21 [phone]

## 2021-01-12 NOTE — ED Triage Notes (Signed)
Pt last seen on 7/15 for uti sxs. Pt reports after completing the course of antibiotics, she felt relief until two days ago when sxs returned. She c/o urinary frequency, urgency, dysuria, low back and abdominal pain. Has some urine odor.  Denies hematuria. Has tried AZO without relief.

## 2021-01-18 ENCOUNTER — Other Ambulatory Visit: Payer: Self-pay | Admitting: Obstetrics & Gynecology

## 2021-03-15 ENCOUNTER — Other Ambulatory Visit: Payer: Self-pay

## 2021-03-15 DIAGNOSIS — Z3041 Encounter for surveillance of contraceptive pills: Secondary | ICD-10-CM

## 2021-03-15 MED ORDER — NORGESTIMATE-ETH ESTRADIOL 0.25-35 MG-MCG PO TABS: 1.0000 | ORAL_TABLET | Freq: Every day | ORAL | 3 refills | Status: DC

## 2021-03-15 MED ORDER — NORGESTIMATE-ETH ESTRADIOL 0.25-35 MG-MCG PO TABS: 1.0000 | ORAL_TABLET | Freq: Every day | ORAL | 0 refills | Status: DC

## 2021-03-15 NOTE — Telephone Encounter (Signed)
Patient lost birth control pack of pills. Sent in one month for replacement. See mychart message from patient. Armandina Stammer RN

## 2021-04-11 ENCOUNTER — Other Ambulatory Visit: Payer: Self-pay

## 2021-04-11 DIAGNOSIS — Z3041 Encounter for surveillance of contraceptive pills: Secondary | ICD-10-CM

## 2021-04-11 MED ORDER — NORGESTIMATE-ETH ESTRADIOL 0.25-35 MG-MCG PO TABS: 1.0000 | ORAL_TABLET | Freq: Every day | ORAL | 0 refills | Status: DC

## 2021-06-10 ENCOUNTER — Other Ambulatory Visit: Payer: Self-pay | Admitting: Family Medicine

## 2021-06-10 DIAGNOSIS — Z3041 Encounter for surveillance of contraceptive pills: Secondary | ICD-10-CM

## 2021-06-28 ENCOUNTER — Encounter: Payer: Self-pay | Admitting: General Practice

## 2021-07-16 ENCOUNTER — Encounter: Payer: Self-pay | Admitting: Emergency Medicine

## 2021-07-16 ENCOUNTER — Ambulatory Visit
Admission: EM | Admit: 2021-07-16 | Discharge: 2021-07-16 | Disposition: A | Discharge status: Home / Self Care | Payer: Medicaid Other | Attending: Physician Assistant | Admitting: Physician Assistant

## 2021-07-16 ENCOUNTER — Other Ambulatory Visit: Payer: Self-pay

## 2021-07-16 DIAGNOSIS — N3000 Acute cystitis without hematuria: Secondary | ICD-10-CM | POA: Insufficient documentation

## 2021-07-16 LAB — POCT URINALYSIS DIP (MANUAL ENTRY)
Glucose, UA: 250 mg/dL — AB
Nitrite, UA: POSITIVE — AB
Protein Ur, POC: >=300 mg/dL — AB
Spec Grav, UA: 1.015 (ref 1.010–1.025)
Urobilinogen, UA: >=8 E.U./dL — AB
pH, UA: 6 (ref 5.0–8.0)

## 2021-07-16 MED ORDER — NITROFURANTOIN MONOHYD MACRO 100 MG PO CAPS: 100.0000 mg | ORAL_CAPSULE | Freq: Two times a day (BID) | ORAL | 0 refills | Status: AC

## 2021-07-16 NOTE — ED Triage Notes (Signed)
Pt sts dysuria x 3 days with hx of same in past

## 2021-07-16 NOTE — ED Provider Notes (Signed)
[facility]    CSN: [id number] Arrival date & time: 07/16/21  1127      History   Chief Complaint Chief Complaint  Patient presents with   Dysuria         HPI Isabella Sloan is a 21 y.o. female.   Patient here today for evaluation of dysuria that started 3 days ago. She has had lower abdominal pain as well. She notes that her symptoms feel the same as previous UTIs. She has not had fever. She denies any nausea or vomiting. She has tried OTC treatment without significant relief.   The history is provided by the patient.   History reviewed. No pertinent past medical history.  Patient Active Problem List   Diagnosis Date Noted   Left knee injury 02/19/2013    Past Surgical History:  Procedure Laterality Date   WISDOM TOOTH EXTRACTION      OB History     Gravida  0   Para  0   Term  0   Preterm  0   AB  0   Living  0      SAB  0   IAB  0   Ectopic  0   Multiple  0   Live Births  0            Home Medications    Prior to Admission medications   Medication Sig Start Date End Date Taking? Authorizing Provider  nitrofurantoin, macrocrystal-monohydrate, (MACROBID) 100 MG capsule Take 1 capsule (100 mg total) by mouth 2 (two) times daily. 07/16/21  Yes Francene Finders, PA-C  fluconazole (DIFLUCAN) 150 MG tablet Take 1 tablet (150 mg total) by mouth once a week. Patient not taking: Reported on 07/16/2021 01/12/21   Volney American, PA-C  norgestimate-ethinyl estradiol (ORTHO-CYCLEN) 0.25-35 MG-MCG tablet TAKE 1 TABLET BY MOUTH EVERY DAY 06/14/21   Truett Mainland, DO    Family History Family History  Problem Relation Age of Onset   Cancer Maternal Grandfather    Hypertension Mother    Sudden death Neg Hx    Hyperlipidemia Neg Hx    Heart attack Neg Hx    Diabetes Neg Hx     Social History Social History   Tobacco Use   Smoking status: Former    Types: E-cigarettes   Smokeless tobacco: Never  Vaping Use   Vaping Use:  Every day  Substance Use Topics   Alcohol use: No   Drug use: No     Allergies   Patient has no known allergies.   Review of Systems Review of Systems  Constitutional:  Negative for chills and fever.  Respiratory:  Negative for shortness of breath.   Cardiovascular:  Negative for chest pain.  Gastrointestinal:  Positive for abdominal pain. Negative for nausea and vomiting.  Genitourinary:  Positive for dysuria and frequency.  Musculoskeletal:  Positive for back pain (lower back pain- unsure if related to urinary symptoms per patient).    Physical Exam Triage Vital Signs ED Triage Vitals  Enc Vitals Group     BP      Pulse      Resp      Temp      Temp src      SpO2      Weight      Height      Head Circumference      Peak Flow      Pain Score      Pain  Loc      Pain Edu?      Excl. in [place]?    No data found.  Updated Vital Signs BP 125/85 (BP Location: Left Arm)    Pulse (!) 112    Temp 98.2 F (36.8 C) (Oral)    Resp 18    SpO2 98%   Physical Exam Vitals and nursing note reviewed.  Constitutional:      General: She is not in acute distress.    Appearance: Normal appearance. She is not ill-appearing.  HENT:     Head: Normocephalic and atraumatic.     Nose: Nose normal.  Cardiovascular:     Rate and Rhythm: Normal rate and regular rhythm.     Heart sounds: Normal heart sounds. No murmur heard. Pulmonary:     Effort: Pulmonary effort is normal. No respiratory distress.     Breath sounds: Normal breath sounds. No wheezing, rhonchi or rales.  Skin:    General: Skin is warm and dry.  Neurological:     Mental Status: She is alert.  Psychiatric:        Mood and Affect: Mood normal.        Thought Content: Thought content normal.     UC Treatments / Results  Labs (all labs ordered are listed, but only abnormal results are displayed) Labs Reviewed  POCT URINALYSIS DIP (MANUAL ENTRY) - Abnormal; Notable for the following components:      Result Value    Color, UA orange (*)    Clarity, UA hazy (*)    Glucose, UA =250 (*)    Bilirubin, UA moderate (*)    Ketones, POC UA small (15) (*)    Blood, UA trace-intact (*)    Protein Ur, POC >=300 (*)    Urobilinogen, UA >=8.0 (*)    Nitrite, UA Positive (*)    Leukocytes, UA Large (3+) (*)    All other components within normal limits  URINE CULTURE    EKG   Radiology No results found.  Procedures Procedures (including critical care time)  Medications Ordered in UC Medications - No data to display  Initial Impression / Assessment and Plan / UC Course  I have reviewed the triage vital signs and the nursing notes.  Pertinent labs & imaging results that were available during my care of the patient were reviewed by me and considered in my medical decision making (see chart for details).    UA likely inaccurate due to color interference but will treat based on symptoms. Urine culture ordered. Encouraged follow up if symptoms fail to improve with treatment or worsen in any way.   Final Clinical Impressions(s) / UC Diagnoses   Final diagnoses:  Acute cystitis without hematuria   Discharge Instructions   None    ED Prescriptions     Medication Sig Dispense Auth. Provider   nitrofurantoin, macrocrystal-monohydrate, (MACROBID) 100 MG capsule Take 1 capsule (100 mg total) by mouth 2 (two) times daily. 10 capsule Francene Finders, PA-C      PDMP not reviewed this encounter.   Francene Finders, PA-C 07/17/21 [phone]

## 2021-07-17 ENCOUNTER — Encounter: Payer: Self-pay | Admitting: Physician Assistant

## 2021-07-18 LAB — URINE CULTURE: Culture: >=100000 — AB

## 2021-09-05 ENCOUNTER — Other Ambulatory Visit: Payer: Self-pay

## 2021-09-05 DIAGNOSIS — Z3041 Encounter for surveillance of contraceptive pills: Secondary | ICD-10-CM

## 2021-09-05 MED ORDER — NORGESTIMATE-ETH ESTRADIOL 0.25-35 MG-MCG PO TABS: 1.0000 | ORAL_TABLET | Freq: Every day | ORAL | 0 refills | Status: DC

## 2021-09-05 NOTE — Progress Notes (Signed)
Patient scheduled for annual exam. Patient given refill on birth control until that visit. Armandina Stammer RN  ?

## 2021-10-03 ENCOUNTER — Encounter: Payer: Self-pay | Admitting: Advanced Practice Midwife

## 2021-10-03 ENCOUNTER — Ambulatory Visit (INDEPENDENT_AMBULATORY_CARE_PROVIDER_SITE_OTHER): Payer: Medicaid Other | Admitting: Advanced Practice Midwife

## 2021-10-03 VITALS — BP 118/75 | HR 78 | Ht 66.5 in | Wt 153.1 lb

## 2021-10-03 DIAGNOSIS — Z3041 Encounter for surveillance of contraceptive pills: Secondary | ICD-10-CM | POA: Diagnosis not present

## 2021-10-03 DIAGNOSIS — Z01419 Encounter for gynecological examination (general) (routine) without abnormal findings: Secondary | ICD-10-CM | POA: Insufficient documentation

## 2021-10-03 MED ORDER — NORGESTIMATE-ETH ESTRADIOL 0.25-35 MG-MCG PO TABS: 1.0000 | ORAL_TABLET | Freq: Every day | ORAL | 5 refills | Status: AC

## 2021-10-03 NOTE — Progress Notes (Signed)
GYNECOLOGY ANNUAL PREVENTATIVE CARE ENCOUNTER NOTE ? ?Subjective:  ? Isabella Sloan is a 21 y.o. G0P0000 female here for a routine annual gynecologic exam.  Current complaints: none.  Doing well on oral contraceptives.  No BTB or cramps.   Denies abnormal vaginal bleeding, discharge, pelvic pain, problems with intercourse or other gynecologic concerns.  ?  ?Gynecologic History ?Patient's last menstrual period was 09/30/2021. ?Contraception: OCP (estrogen/progesterone) ?Last Pap: 09/01/19. Results were: normal ? ? ?Obstetric History ?OB History  ?Gravida Para Term Preterm AB Living  ?0 0 0 0 0 0  ?SAB IAB Ectopic Multiple Live Births  ?0 0 0 0 0  ? ? ?No past medical history on file. ? ?Past Surgical History:  ?Procedure Laterality Date  ? WISDOM TOOTH EXTRACTION    ? ? ?Current Outpatient Medications on File Prior to Visit  ?Medication Sig Dispense Refill  ? fluconazole (DIFLUCAN) 150 MG tablet Take 1 tablet (150 mg total) by mouth once a week. (Patient not taking: Reported on 07/16/2021) 2 tablet 0  ? nitrofurantoin, macrocrystal-monohydrate, (MACROBID) 100 MG capsule Take 1 capsule (100 mg total) by mouth 2 (two) times daily. (Patient not taking: Reported on 10/03/2021) 10 capsule 0  ? ?No current facility-administered medications on file prior to visit.  ? ? ?No Known Allergies ? ?Social History  ? ?Socioeconomic History  ? Marital status: Single  ?  Spouse name: Not on file  ? Number of children: Not on file  ? Years of education: Not on file  ? Highest education level: High school graduate  ?Occupational History  ? Not on file  ?Tobacco Use  ? Smoking status: Former  ?  Types: E-cigarettes  ? Smokeless tobacco: Never  ?Vaping Use  ? Vaping Use: Every day  ?Substance and Sexual Activity  ? Alcohol use: No  ? Drug use: No  ? Sexual activity: Yes  ?  Birth control/protection: Pill  ?Other Topics Concern  ? Not on file  ?Social History Narrative  ? Not on file  ? ?Social Determinants of Health  ? ?Financial Resource  Strain: Not on file  ?Food Insecurity: Not on file  ?Transportation Needs: Not on file  ?Physical Activity: Not on file  ?Stress: Not on file  ?Social Connections: Not on file  ?Intimate Partner Violence: Not on file  ? ? ?Family History  ?Problem Relation Age of Onset  ? Cancer Maternal Grandfather   ? Hypertension Mother   ? Sudden death Neg Hx   ? Hyperlipidemia Neg Hx   ? Heart attack Neg Hx   ? Diabetes Neg Hx   ? ? ?The following portions of the patient's history were reviewed and updated as appropriate: allergies, current medications, past family history, past medical history, past social history, past surgical history and problem list. ? ?Review of Systems ?Pertinent items noted in HPI and remainder of comprehensive ROS otherwise negative. ?  ?Objective:  ?BP 118/75   Pulse 78   Ht 5' 6.5" (1.689 m)   Wt 153 lb 1.9 oz (69.5 kg)   LMP 09/30/2021   BMI 24.34 kg/m?  ?CONSTITUTIONAL: Well-developed, well-nourished female in no acute distress.  ?HENT:  Normocephalic, atraumatic, External right and left ear normal. ?EYES: Conjunctivae and EOM are normal. No scleral icterus.  ?NECK: Normal range of motion, supple, no masses.  Normal thyroid.  ?SKIN: Skin is warm and dry. No rash noted. Not diaphoretic. No erythema. No pallor. ?NEUROLOGIC: Alert and oriented to person, place, and time. Normal reflexes, muscle tone  coordination. No cranial nerve deficit noted. ?PSYCHIATRIC: Normal mood and affect. Normal behavior. Normal judgment and thought content. ?CARDIOVASCULAR: Normal heart rate noted, regular rhythm ?RESPIRATORY: Clear to auscultation bilaterally. Effort and breath sounds normal, no problems with respiration noted. ?BREASTS: Symmetric in size. No masses, skin changes, nipple drainage, or lymphadenopathy. ?ABDOMEN: Soft, normal bowel sounds, no distention noted.  No tenderness, rebound or guarding.  ?PELVIC: Normal appearing external genitalia; normal appearing vaginal mucosa and cervix.  No abnormal  discharge noted.   Normal uterine size, no other palpable masses, no uterine or adnexal tenderness. ?MUSCULOSKELETAL: Normal range of motion. No tenderness.  No cyanosis, clubbing, or edema.  2+ distal pulses. ? ? ?Assessment:  ?Annual gynecologic examination without pap smear (due next year) ?  ?Plan:  ?Will repeat Pap next year ?Refilled OCPs with 90 day supply ([facility]) ?Routine preventative health maintenance measures emphasized. ?Please refer to After Visit Summary for other counseling recommendations.  ? ? ? ?
